# Patient Record
Sex: Female | Born: 1965 | Hispanic: Refuse to answer | Marital: Married
Health system: Southern US, Community
[De-identification: ages and names within clinical notes are randomized; demographics above are authoritative.]

## PROBLEM LIST (undated history)

## (undated) ENCOUNTER — Emergency Department (HOSPITAL_BASED_OUTPATIENT_CLINIC_OR_DEPARTMENT_OTHER): Admission: EM | Payer: PRIVATE HEALTH INSURANCE | Source: Home / Self Care

---

## 1999-03-11 ENCOUNTER — Ambulatory Visit: Admission: RE | Admit: 1999-03-11 | Discharge: 1999-03-11 | Payer: Self-pay | Admitting: Radiation Oncology

## 1999-04-29 ENCOUNTER — Other Ambulatory Visit: Admission: RE | Admit: 1999-04-29 | Discharge: 1999-04-29 | Payer: Self-pay | Admitting: Gynecology

## 1999-11-27 ENCOUNTER — Other Ambulatory Visit: Admission: RE | Admit: 1999-11-27 | Discharge: 1999-11-27 | Payer: Self-pay | Admitting: Gynecology

## 1999-12-17 ENCOUNTER — Ambulatory Visit (HOSPITAL_COMMUNITY): Admission: RE | Admit: 1999-12-17 | Discharge: 1999-12-17 | Payer: Self-pay | Admitting: Internal Medicine

## 2001-02-26 ENCOUNTER — Encounter: Admission: RE | Admit: 2001-02-26 | Discharge: 2001-02-26 | Payer: Self-pay | Admitting: Gynecology

## 2001-02-26 ENCOUNTER — Encounter: Payer: Self-pay | Admitting: Gynecology

## 2003-03-22 ENCOUNTER — Other Ambulatory Visit: Admission: RE | Admit: 2003-03-22 | Discharge: 2003-03-22 | Payer: Self-pay | Admitting: Internal Medicine

## 2003-03-29 ENCOUNTER — Encounter: Admission: RE | Admit: 2003-03-29 | Discharge: 2003-03-29 | Payer: Self-pay | Admitting: Internal Medicine

## 2003-04-25 ENCOUNTER — Encounter: Admission: RE | Admit: 2003-04-25 | Discharge: 2003-04-25 | Payer: Self-pay | Admitting: Internal Medicine

## 2005-03-18 ENCOUNTER — Encounter: Admission: RE | Admit: 2005-03-18 | Discharge: 2005-03-18 | Payer: Self-pay | Admitting: Internal Medicine

## 2005-04-03 ENCOUNTER — Encounter: Admission: RE | Admit: 2005-04-03 | Discharge: 2005-04-03 | Payer: Self-pay | Admitting: Internal Medicine

## 2005-05-20 ENCOUNTER — Other Ambulatory Visit: Admission: RE | Admit: 2005-05-20 | Discharge: 2005-05-20 | Payer: Self-pay | Admitting: Internal Medicine

## 2005-06-11 ENCOUNTER — Encounter: Admission: RE | Admit: 2005-06-11 | Discharge: 2005-06-11 | Payer: Self-pay | Admitting: Internal Medicine

## 2006-06-17 ENCOUNTER — Other Ambulatory Visit: Admission: RE | Admit: 2006-06-17 | Discharge: 2006-06-17 | Payer: Self-pay | Admitting: *Deleted

## 2008-07-22 ENCOUNTER — Ambulatory Visit: Payer: Self-pay | Admitting: Family Medicine

## 2008-07-22 DIAGNOSIS — L0291 Cutaneous abscess, unspecified: Secondary | ICD-10-CM | POA: Insufficient documentation

## 2008-07-22 DIAGNOSIS — L0390 Cellulitis, unspecified: Secondary | ICD-10-CM

## 2009-02-04 ENCOUNTER — Ambulatory Visit: Payer: Self-pay | Admitting: Family Medicine

## 2009-02-04 DIAGNOSIS — N3000 Acute cystitis without hematuria: Secondary | ICD-10-CM | POA: Insufficient documentation

## 2009-02-04 LAB — CONVERTED CEMR LAB
Bilirubin Urine: NEGATIVE
Glucose, Urine, Semiquant: NEGATIVE
Urobilinogen, UA: 0.2
pH: 5.5

## 2009-02-05 ENCOUNTER — Encounter: Payer: Self-pay | Admitting: Family Medicine

## 2009-05-21 ENCOUNTER — Ambulatory Visit: Payer: Self-pay | Admitting: Family Medicine

## 2009-05-21 LAB — CONVERTED CEMR LAB
Bilirubin Urine: NEGATIVE
Glucose, Urine, Semiquant: NEGATIVE
Protein, U semiquant: NEGATIVE
Specific Gravity, Urine: 1.015
pH: 6

## 2009-05-22 ENCOUNTER — Encounter: Payer: Self-pay | Admitting: Family Medicine

## 2009-09-29 ENCOUNTER — Ambulatory Visit: Payer: Self-pay | Admitting: Emergency Medicine

## 2009-09-29 DIAGNOSIS — J209 Acute bronchitis, unspecified: Secondary | ICD-10-CM | POA: Insufficient documentation

## 2010-03-17 ENCOUNTER — Encounter: Payer: Self-pay | Admitting: Internal Medicine

## 2010-03-26 NOTE — Assessment & Plan Note (Signed)
Summary: Cough-yellowish, SOB x 2 wks rm 1   Vital Signs:  Patient Profile:   45 Years Old Female CC:      Cold & URI symptoms Height:     64 inches Weight:      135 pounds O2 Sat:      97 % O2 treatment:    Room Air Temp:     98.6 degrees F oral Pulse rate:   79 / minute Pulse rhythm:   regular Resp:     16 per minute BP sitting:   113 / 80  (right arm) Cuff size:   regular  Vitals Entered By: Areta Haber CMA (September 29, 2009 12:19 PM)                  Current Allergies: ! MORPHINE ! COMPAZINE      History of Present Illness Chief Complaint: Cold & URI symptoms History of Present Illness: Cold & URI symptoms for 2 weeks.  Was getting better, but now feels like it is a tickle in her chest and she is coughing.  Also coughing up green phlegm.  She has had bronchitis in the past and this feels the same.  No F/C/N/V.  OTC cough & cold meds help a little bit.  She also has felt some minor wheezing as well and wants to know if she can have an inhaler.  Current Problems: BRONCHITIS, VIRAL (ICD-466.0) ACUTE CYSTITIS (ICD-595.0) ACUTE CYSTITIS (ICD-595.0) CELLULITIS (ICD-682.9)   Current Meds LEVOXYL 125 MCG TABS (LEVOTHYROXINE SODIUM) once a day AUGMENTIN 875-125 MG TABS (AMOXICILLIN-POT CLAVULANATE) 1 tab by mouth two times a day for 10 days VENTOLIN HFA 108 (90 BASE) MCG/ACT AERS (ALBUTEROL SULFATE) 1-2 puffs every 6-8 hours as needed wheeze/cough TESSALON 200 MG CAPS (BENZONATATE) 1 tab by mouth up to three times a day as needed for cough  REVIEW OF SYSTEMS Constitutional Symptoms      Denies fever, chills, night sweats, weight loss, weight gain, and fatigue.  Eyes       Denies change in vision, eye pain, eye discharge, glasses, contact lenses, and eye surgery. Ear/Nose/Throat/Mouth       Complains of frequent runny nose.      Denies hearing loss/aids, change in hearing, ear pain, ear discharge, dizziness, frequent nose bleeds, sinus problems, sore throat,  hoarseness, and tooth pain or bleeding.  Respiratory       Complains of productive cough, wheezing, and shortness of breath.      Denies dry cough, asthma, bronchitis, and emphysema/COPD.  Cardiovascular       Denies murmurs, chest pain, and tires easily with exhertion.    Gastrointestinal       Denies stomach pain, nausea/vomiting, diarrhea, constipation, blood in bowel movements, and indigestion. Genitourniary       Denies painful urination, kidney stones, and loss of urinary control. Neurological       Denies paralysis, seizures, and fainting/blackouts. Musculoskeletal       Denies muscle pain, joint pain, joint stiffness, decreased range of motion, redness, swelling, muscle weakness, and gout.  Skin       Denies bruising, unusual mles/lumps or sores, and hair/skin or nail changes.  Psych       Denies mood changes, temper/anger issues, anxiety/stress, speech problems, depression, and sleep problems. Other Comments: x 2 wks. Pt has not seen her PCP for this.   Past History:  Past Medical History: Last updated: 07/22/2008 hypothroidism history of erythema nedosium  Past Surgical History: Last updated:  07/22/2008 cervical laminectomy 1998 emergent hysterectomy after childbirth 1991  Family History: Last updated: 07/22/2008 mother - history of ovarian and breast cancer father, two brothers and one sister all alive and healthy  Social History: Last updated: 02/04/2009 denies recreational drug use drinks socially Current Smoker  Risk Factors: Smoking Status: current (02/04/2009) Physical Exam General appearance: well developed, well nourished, no acute distress Ears: normal, no lesions or deformities Nasal: mucosa pink, nonedematous, no septal deviation, turbinates normal Neck: neck supple,  trachea midline, no masses Chest/Lungs: no rales, wheezes, or rhonchi bilateral, breath sounds equal without effort Heart: regular rate and  rhythm, no murmur Skin: no obvious  rashes or lesions Assessment New Problems: BRONCHITIS, VIRAL (ICD-466.0)   Patient Education: Patient and/or caregiver instructed in the following: rest, fluids, Ibuprofen prn. Demonstrates willingness to comply.  Plan New Medications/Changes: TESSALON 200 MG CAPS (BENZONATATE) 1 tab by mouth up to three times a day as needed for cough  #21 x 0, 09/29/2009, Hoyt Koch MD VENTOLIN HFA 108 (90 BASE) MCG/ACT AERS (ALBUTEROL SULFATE) 1-2 puffs every 6-8 hours as needed wheeze/cough  #1 x 0, 09/29/2009, Hoyt Koch MD AUGMENTIN [phone] MG TABS (AMOXICILLIN-POT CLAVULANATE) 1 tab by mouth two times a day for 10 days  #20 x 0, 09/29/2009, Hoyt Koch MD  New Orders: Est. Patient Level II [phone] Planning Comments:   Take the meds as directed.  If worsening symptoms, fever, cough, Follow-up with your primary care physician   The patient and/or caregiver has been counseled thoroughly with regard to medications prescribed including dosage, schedule, interactions, rationale for use, and possible side effects and they verbalize understanding.  Diagnoses and expected course of recovery discussed and will return if not improved as expected or if the condition worsens. Patient and/or caregiver verbalized understanding.  Prescriptions: TESSALON 200 MG CAPS (BENZONATATE) 1 tab by mouth up to three times a day as needed for cough  #21 x 0   Entered and Authorized by:   Hoyt Koch MD   Signed by:   Hoyt Koch MD on 09/29/2009   Method used:   Printed then faxed to ...       [facility]  [address] [phone]* (retail)       [address] [place], [place]  [address]       Ph: [phone] or [phone]       Fax: [phone]   RxID:   [id number] VENTOLIN HFA 108 (90 BASE) MCG/ACT AERS (ALBUTEROL SULFATE) 1-2 puffs every 6-8 hours as needed wheeze/cough  #1 x 0   Entered and Authorized by:   Hoyt Koch MD   Signed by:   Hoyt Koch MD on 09/29/2009    Method used:   Printed then faxed to ...       [facility]  [address] [phone]* (retail)       [address] [place], [place]  [address]       Ph: [phone] or [phone]       Fax: [phone]   RxID:   [id number] AUGMENTIN 875-125 MG TABS (AMOXICILLIN-POT CLAVULANATE) 1 tab by mouth two times a day for 10 days  #20 x 0   Entered and Authorized by:   Hoyt Koch MD   Signed by:   Hoyt Koch MD on 09/29/2009   Method used:   Printed then faxed to ...       [facility]  [address] [phone]* (retail)  [address]       [place], [place]  [address]       Ph: [phone] or [phone]       Fax: [phone]   RxID:   [id number]   Orders Added: 1)  Est. Patient Level II [440[address]]

## 2010-03-26 NOTE — Assessment & Plan Note (Signed)
Summary: Frequent, painful urination x 4 dys rm 3   Vital Signs:  Patient Profile:   45 Years Old Female CC:      Frequent, painful urination x 4 dys Height:     64 inches Weight:      134 pounds O2 Sat:      100 % O2 treatment:    Room Air Temp:     98.1 degrees F oral Pulse rate:   72 / minute Pulse rhythm:   regular Resp:     16 per minute BP sitting:   116 / 80  (right arm) Cuff size:   regular  Vitals Entered By: Areta Haber CMA (May 21, 2009 6:29 PM)                  Current Allergies: ! MORPHINE ! COMPAZINE     History of Present Illness Chief Complaint: Frequent, painful urination x 4 dys History of Present Illness: Subjective:  Patient presents complaining of UTI symptoms for 4 days.  Complains of dysuria, frequency, and urgency.  No hematuria or nocturia.  No abnormal vaginal discharge.  No fever/chills/sweats.  No abdominal pain.  + mild right flank pain.  No nausea/vomiting.  Has had a hysterectomy. She notes that during her last UTI she became acutely sick while she was filling her antibiotic prescription, and was hospitalized for pyelonephritis. She reports that she is developing UTI's more frequently.  Current Problems: ACUTE CYSTITIS (ICD-595.0) ACUTE CYSTITIS (ICD-595.0) CELLULITIS (ICD-682.9)   Current Meds LEVOXYL 125 MCG TABS (LEVOTHYROXINE SODIUM) once a day CEPHALEXIN 500 MG CAPS (CEPHALEXIN) One by mouth two times a day  REVIEW OF SYSTEMS Constitutional Symptoms      Denies fever, chills, night sweats, weight loss, weight gain, and fatigue.  Eyes       Denies change in vision, eye pain, eye discharge, glasses, contact lenses, and eye surgery. Ear/Nose/Throat/Mouth       Denies hearing loss/aids, change in hearing, ear pain, ear discharge, dizziness, frequent runny nose, frequent nose bleeds, sinus problems, sore throat, hoarseness, and tooth pain or bleeding.  Respiratory       Denies dry cough, productive cough, wheezing,  shortness of breath, asthma, bronchitis, and emphysema/COPD.  Cardiovascular       Denies murmurs, chest pain, and tires easily with exhertion.    Gastrointestinal       Denies stomach pain, nausea/vomiting, diarrhea, constipation, blood in bowel movements, and indigestion. Genitourniary       Complains of painful urination.      Denies kidney stones and loss of urinary control.      Comments: Frequent x 4 dys Neurological       Denies paralysis, seizures, and fainting/blackouts. Musculoskeletal       Denies muscle pain, joint pain, joint stiffness, decreased range of motion, redness, swelling, muscle weakness, and gout.  Skin       Denies bruising, unusual mles/lumps or sores, and hair/skin or nail changes.  Psych       Denies mood changes, temper/anger issues, anxiety/stress, speech problems, depression, and sleep problems. Other Comments: Pt has not seen PCP for this. Pt states that she has been drinking cranberry juice.   Past History:  Past Medical History: Last updated: 07/22/2008 hypothroidism history of erythema nedosium  Past Surgical History: Last updated: 07/22/2008 cervical laminectomy 1998 emergent hysterectomy after childbirth 1991  Family History: Last updated: 07/22/2008 mother - history of ovarian and breast cancer father, two brothers and one sister all alive  and healthy  Social History: Last updated: 02/04/2009 denies recreational drug use drinks socially Current Smoker  Risk Factors: Smoking Status: current (02/04/2009)   Objective:  Appearance:  Patient appears healthy, stated age, and in no acute distress  Mouth:  moist mucous membranes  Neck:  Supple.  No adenopathy is present.  Lungs:  Clear to auscultation.  Breath sounds are equal.  Heart:  Regular rate and rhythm without murmurs, rubs, or gallops.  Abdomen:  Nontender without masses or hepatosplenomegaly.  Bowel sounds are present.  Mild right flank tenderness.  urinalysis (dipstick):  Mod blood, small leuks Assessment New Problems: ACUTE CYSTITIS (ICD-595.0)  RECURRENT UTI  Plan New Medications/Changes: CEPHALEXIN 500 MG CAPS (CEPHALEXIN) One by mouth two times a day  #14 x 0, 05/21/2009, Donna Christen MD  New Orders: Urinalysis [81003-65000] Est. Patient Level III [99213] T-Culture, Urine [[id number]] Rocephin  250mg  [J0696] Admin of Therapeutic Inj  intramuscular or subcutaneous [96372] Planning Comments:   Will give Rocephin 500mg  IM to start, then Keflex 500mg  two times a day for one week. Culture pending.  Increase fluid intake. Follow-up with PCP if not improving one week.  Return for worsening symptoms When well, recommed follow-up with urologist for recurring UTI's    Diagnoses and expected course of recovery discussed and will return if not improved as expected or if the condition worsens. Patient and/or caregiver verbalized understanding.  Prescriptions: CEPHALEXIN 500 MG CAPS (CEPHALEXIN) One by mouth two times a day  #14 x 0   Entered and Authorized by:   Donna Christen MD   Signed by:   Donna Christen MD on 05/21/2009   Method used:   Print then Give to Patient   RxID:   [id number]   Laboratory Results   Urine Tests  Date/Time Received: May 21, 2009 6:45 PM  Date/Time Reported: May 21, 2009 6:45 PM   Routine Urinalysis   Color: lt. yellow Appearance: Cloudy Glucose: negative   (Normal Range: Negative) Bilirubin: negative   (Normal Range: Negative) Ketone: negative   (Normal Range: Negative) Spec. Gravity: 1.015   (Normal Range: 1.003-1.035) Blood: moderate   (Normal Range: Negative) pH: 6.0   (Normal Range: 5.0-8.0) Protein: negative   (Normal Range: Negative) Urobilinogen: 0.2   (Normal Range: 0-1) Nitrite: negative   (Normal Range: Negative) Leukocyte Esterace: small   (Normal Range: Negative)         Medication Administration  Injection # 1:    Medication: Rocephin  250mg     Diagnosis: ACUTE CYSTITIS  (ICD-595.0)    Route: IM    Site: RUOQ gluteus    Exp Date: 04/24/2011    Lot #: [id number]    Mfr: Sandox    Comments: Administered 500 mg    Patient tolerated injection without complications    Given by: Areta Haber CMA (May 21, 2009 7:11 PM)  Orders Added: 1)  Urinalysis [81003-65000] 2)  Est. Patient Level III [84696] 3)  T-Culture, Urine [[id number]] 4)  Rocephin  250mg  [J0696] 5)  Admin of Therapeutic Inj  intramuscular or subcutaneous [40102]

## 2011-07-14 ENCOUNTER — Other Ambulatory Visit: Payer: Self-pay | Admitting: *Deleted

## 2011-07-14 DIAGNOSIS — Z1231 Encounter for screening mammogram for malignant neoplasm of breast: Secondary | ICD-10-CM

## 2011-08-12 ENCOUNTER — Ambulatory Visit
Admission: RE | Admit: 2011-08-12 | Discharge: 2011-08-12 | Disposition: A | Discharge status: Home / Self Care | Payer: No Typology Code available for payment source | Source: Ambulatory Visit | Attending: *Deleted | Admitting: *Deleted

## 2011-08-12 DIAGNOSIS — Z1231 Encounter for screening mammogram for malignant neoplasm of breast: Secondary | ICD-10-CM

## 2011-08-18 ENCOUNTER — Other Ambulatory Visit: Payer: Self-pay | Admitting: *Deleted

## 2011-08-18 DIAGNOSIS — R928 Other abnormal and inconclusive findings on diagnostic imaging of breast: Secondary | ICD-10-CM

## 2011-08-25 ENCOUNTER — Ambulatory Visit
Admission: RE | Admit: 2011-08-25 | Discharge: 2011-08-25 | Disposition: A | Discharge status: Home / Self Care | Payer: No Typology Code available for payment source | Source: Ambulatory Visit | Attending: *Deleted | Admitting: *Deleted

## 2011-08-25 ENCOUNTER — Other Ambulatory Visit: Payer: Self-pay | Admitting: *Deleted

## 2011-08-25 DIAGNOSIS — R928 Other abnormal and inconclusive findings on diagnostic imaging of breast: Secondary | ICD-10-CM

## 2011-09-04 ENCOUNTER — Ambulatory Visit
Admission: RE | Admit: 2011-09-04 | Discharge: 2011-09-04 | Disposition: A | Discharge status: Home / Self Care | Payer: No Typology Code available for payment source | Source: Ambulatory Visit | Attending: *Deleted | Admitting: *Deleted

## 2011-09-04 ENCOUNTER — Other Ambulatory Visit: Payer: Self-pay | Admitting: *Deleted

## 2011-09-04 DIAGNOSIS — R928 Other abnormal and inconclusive findings on diagnostic imaging of breast: Secondary | ICD-10-CM

## 2011-09-04 HISTORY — PX: BREAST BIOPSY: SHX20

## 2012-09-10 ENCOUNTER — Other Ambulatory Visit: Payer: Self-pay | Admitting: Physician Assistant

## 2012-09-10 DIAGNOSIS — N6320 Unspecified lump in the left breast, unspecified quadrant: Secondary | ICD-10-CM

## 2012-09-24 ENCOUNTER — Ambulatory Visit
Admission: RE | Admit: 2012-09-24 | Discharge: 2012-09-24 | Disposition: A | Discharge status: Home / Self Care | Payer: No Typology Code available for payment source | Source: Ambulatory Visit

## 2012-09-24 ENCOUNTER — Other Ambulatory Visit: Payer: No Typology Code available for payment source

## 2012-09-24 ENCOUNTER — Ambulatory Visit
Admission: RE | Admit: 2012-09-24 | Discharge: 2012-09-24 | Disposition: A | Discharge status: Home / Self Care | Payer: No Typology Code available for payment source | Source: Ambulatory Visit | Attending: Physician Assistant | Admitting: Physician Assistant

## 2012-09-24 DIAGNOSIS — N6320 Unspecified lump in the left breast, unspecified quadrant: Secondary | ICD-10-CM

## 2013-12-09 ENCOUNTER — Other Ambulatory Visit: Payer: Self-pay | Admitting: Physician Assistant

## 2013-12-09 DIAGNOSIS — N6320 Unspecified lump in the left breast, unspecified quadrant: Secondary | ICD-10-CM

## 2013-12-23 ENCOUNTER — Ambulatory Visit
Admission: RE | Admit: 2013-12-23 | Discharge: 2013-12-23 | Disposition: A | Discharge status: Home / Self Care | Payer: PRIVATE HEALTH INSURANCE | Source: Ambulatory Visit | Attending: Physician Assistant | Admitting: Physician Assistant

## 2013-12-23 DIAGNOSIS — N6320 Unspecified lump in the left breast, unspecified quadrant: Secondary | ICD-10-CM

## 2015-11-15 ENCOUNTER — Other Ambulatory Visit: Payer: Self-pay | Admitting: *Deleted

## 2015-11-15 DIAGNOSIS — Z1231 Encounter for screening mammogram for malignant neoplasm of breast: Secondary | ICD-10-CM

## 2015-11-22 ENCOUNTER — Ambulatory Visit
Admission: RE | Admit: 2015-11-22 | Discharge: 2015-11-22 | Disposition: A | Discharge status: Home / Self Care | Payer: PRIVATE HEALTH INSURANCE | Source: Ambulatory Visit | Attending: *Deleted | Admitting: *Deleted

## 2015-11-22 DIAGNOSIS — Z1231 Encounter for screening mammogram for malignant neoplasm of breast: Secondary | ICD-10-CM

## 2016-02-22 ENCOUNTER — Other Ambulatory Visit: Payer: Self-pay | Admitting: Nurse Practitioner

## 2016-02-22 DIAGNOSIS — R5381 Other malaise: Secondary | ICD-10-CM

## 2017-04-16 ENCOUNTER — Other Ambulatory Visit: Payer: Self-pay | Admitting: Nurse Practitioner

## 2017-04-16 DIAGNOSIS — Z1231 Encounter for screening mammogram for malignant neoplasm of breast: Secondary | ICD-10-CM

## 2019-08-08 ENCOUNTER — Other Ambulatory Visit: Payer: Self-pay | Admitting: *Deleted

## 2019-08-08 DIAGNOSIS — Z1231 Encounter for screening mammogram for malignant neoplasm of breast: Secondary | ICD-10-CM

## 2019-08-24 ENCOUNTER — Ambulatory Visit
Admission: RE | Admit: 2019-08-24 | Discharge: 2019-08-24 | Disposition: A | Discharge status: Home / Self Care | Payer: PRIVATE HEALTH INSURANCE | Source: Ambulatory Visit | Attending: *Deleted | Admitting: *Deleted

## 2019-08-24 ENCOUNTER — Other Ambulatory Visit: Payer: Self-pay

## 2019-08-24 DIAGNOSIS — Z1231 Encounter for screening mammogram for malignant neoplasm of breast: Secondary | ICD-10-CM

## 2020-10-09 ENCOUNTER — Other Ambulatory Visit: Payer: Self-pay | Admitting: *Deleted

## 2020-10-09 DIAGNOSIS — Z1231 Encounter for screening mammogram for malignant neoplasm of breast: Secondary | ICD-10-CM

## 2020-10-11 ENCOUNTER — Ambulatory Visit
Admission: RE | Admit: 2020-10-11 | Discharge: 2020-10-11 | Disposition: A | Discharge status: Home / Self Care | Payer: No Typology Code available for payment source | Source: Ambulatory Visit | Attending: *Deleted | Admitting: *Deleted

## 2020-10-11 ENCOUNTER — Other Ambulatory Visit: Payer: Self-pay

## 2020-10-11 DIAGNOSIS — Z1231 Encounter for screening mammogram for malignant neoplasm of breast: Secondary | ICD-10-CM

## 2022-02-19 ENCOUNTER — Other Ambulatory Visit: Payer: Self-pay

## 2023-06-06 IMAGING — MG MM DIGITAL SCREENING BILAT W/ TOMO AND CAD
8 series · 9 of 24 positions shown · non-contrast
Comparison: Previous exam(s).

CLINICAL DATA: Screening.

EXAM:
DIGITAL SCREENING BILATERAL MAMMOGRAM WITH TOMOSYNTHESIS AND CAD
TECHNIQUE: Bilateral screening digital craniocaudal and mediolateral oblique
mammograms were obtained. Bilateral screening digital breast
tomosynthesis was performed. The images were evaluated with
computer-aided detection.

[L MLO synth-2D]
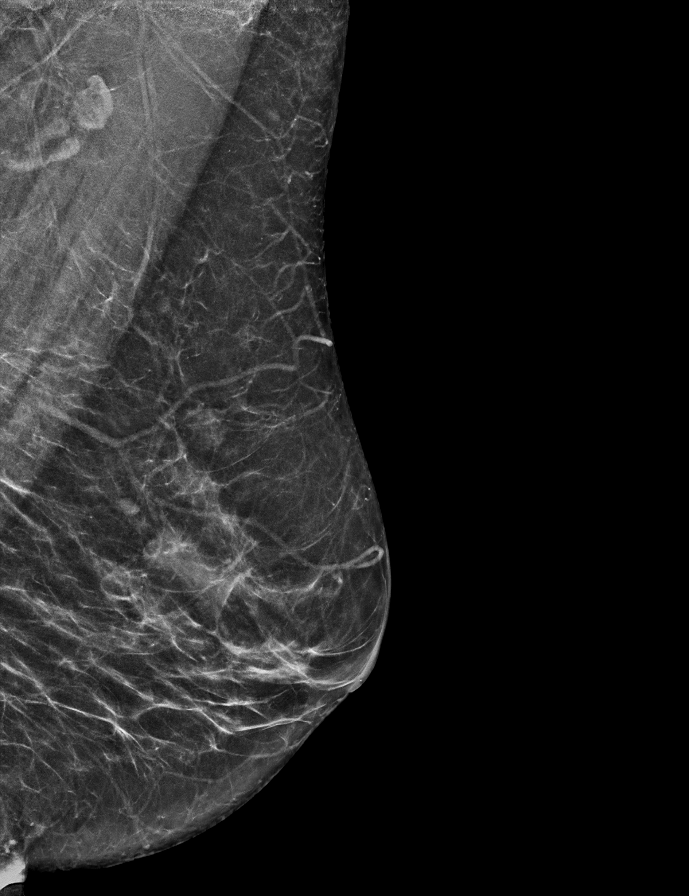

[R CC synth-2D]
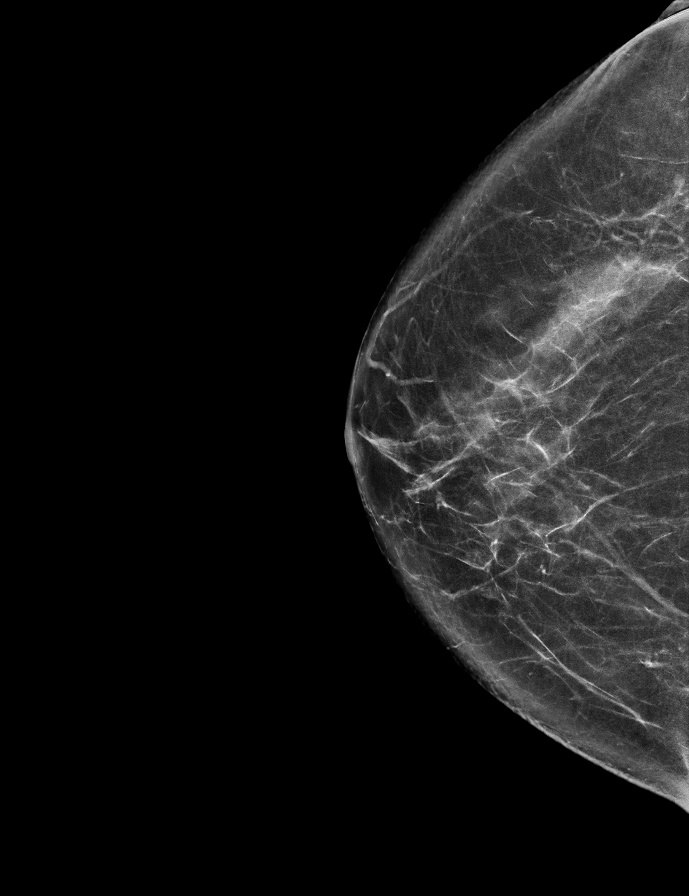

[R MLO synth-2D]
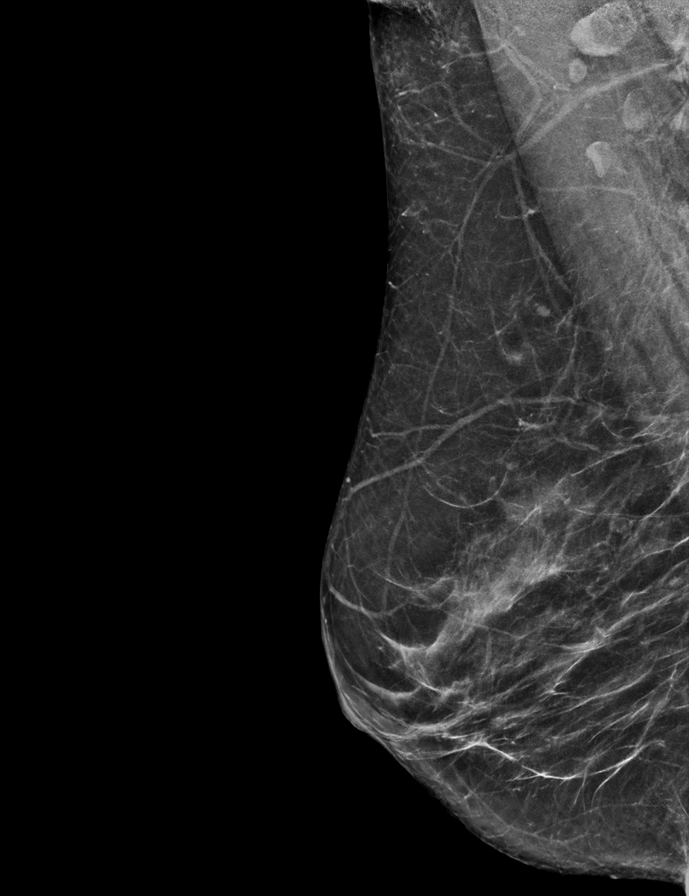

[L CC synth-2D]
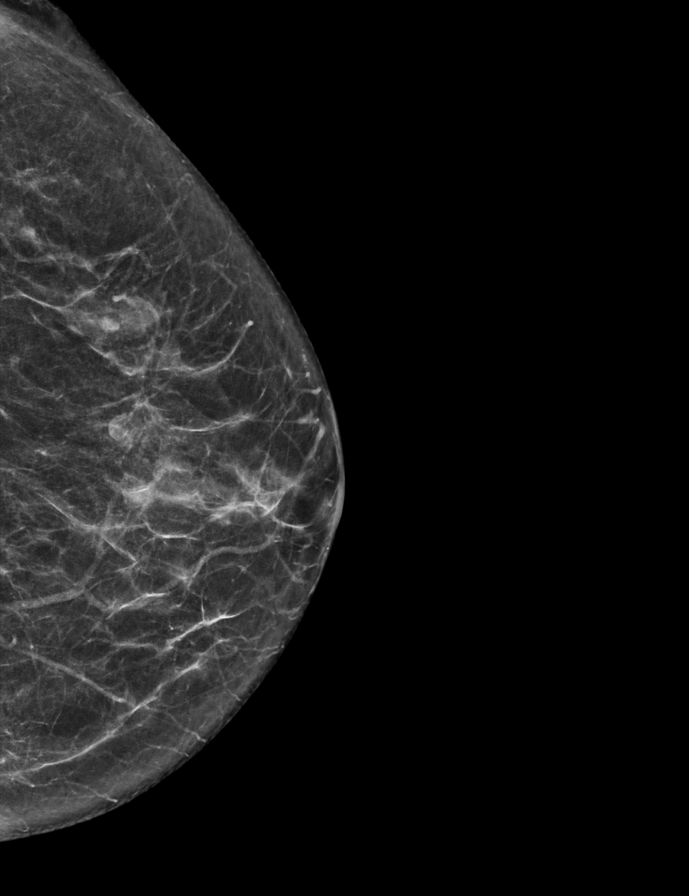

[R CC tomo · 2 of 67 frames shown]
[frame 22/67]
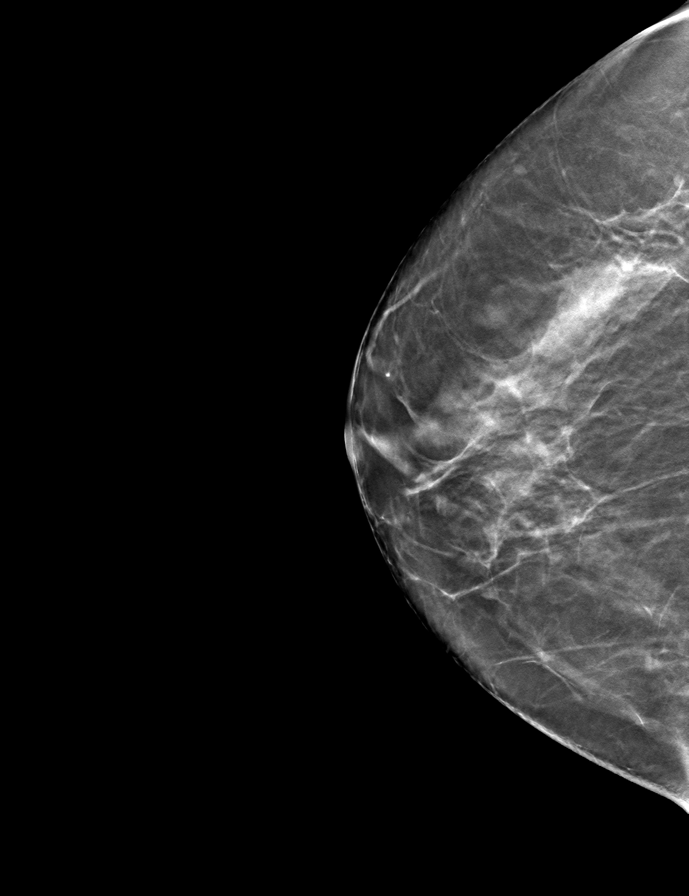
[frame 34/67]
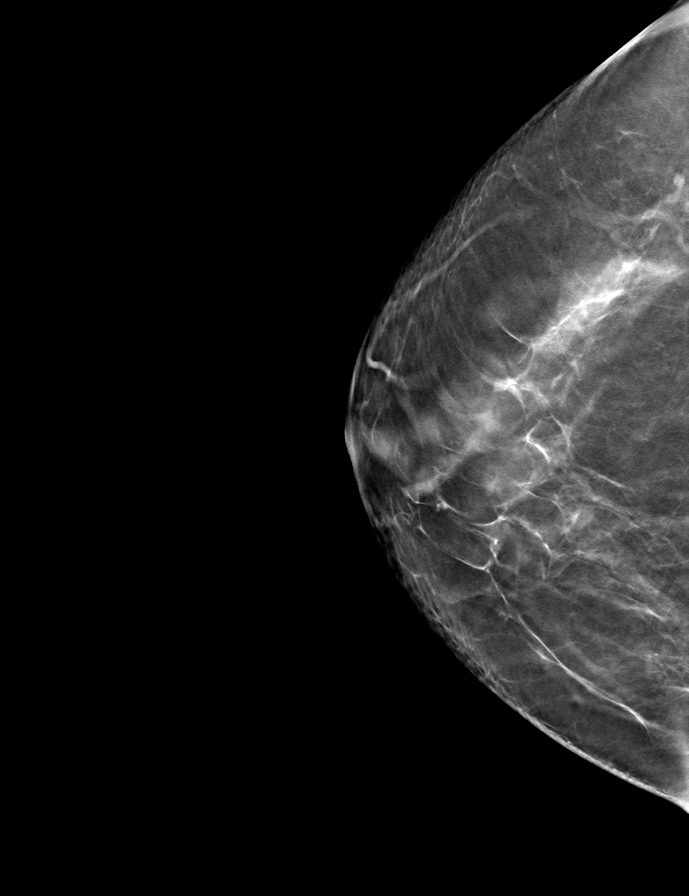

[L MLO tomo · tomo slice 30/59.0]
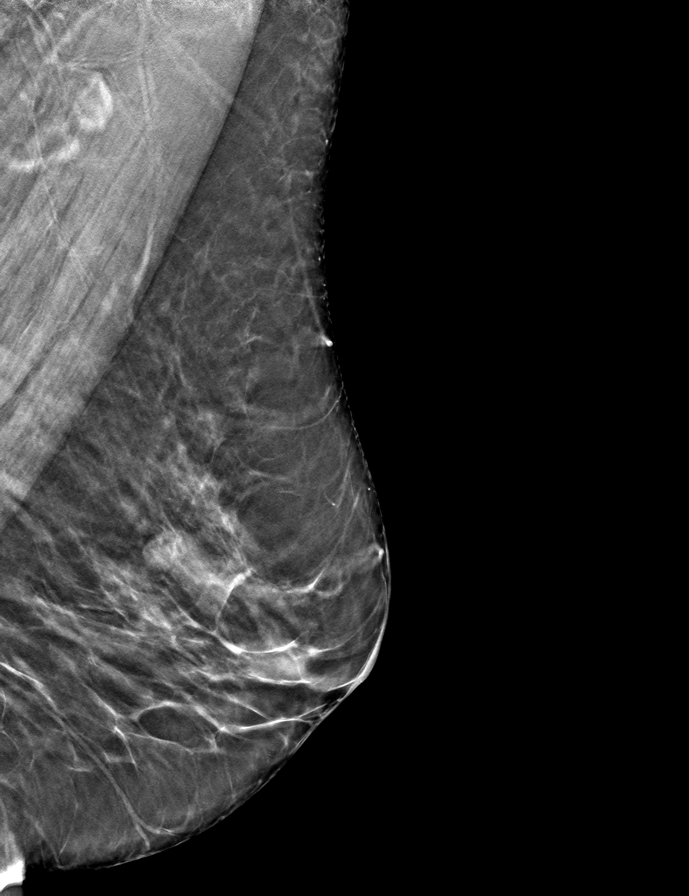

[L CC tomo · tomo slice 29/57.0]
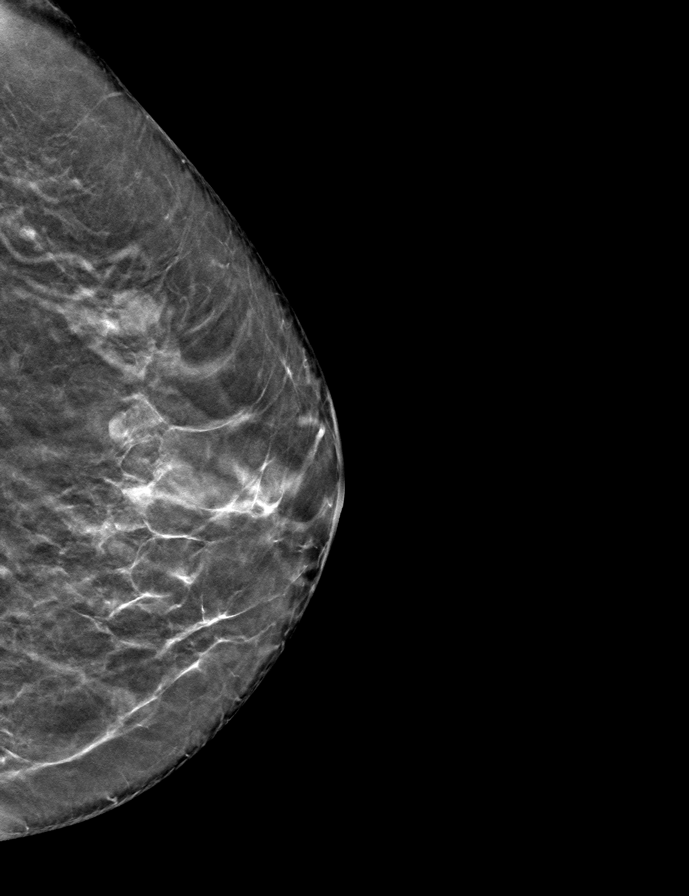

[R MLO tomo · tomo slice 32/63.0]
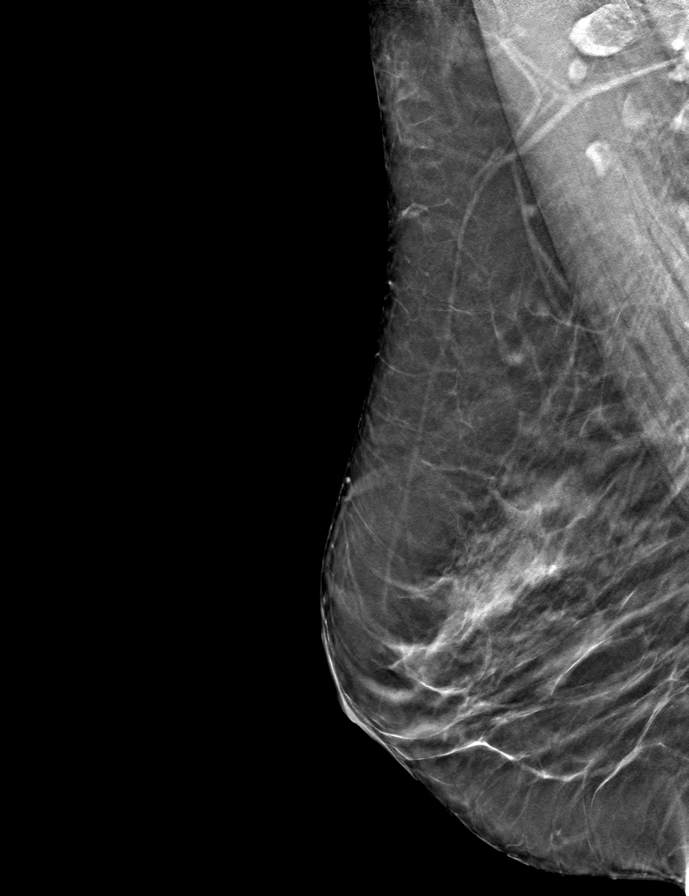

[9 of 24 positions shown; findings below may reference images not displayed]

ACR Breast Density Category b: There are scattered areas of
fibroglandular density.
FINDINGS: There are no findings suspicious for malignancy.
IMPRESSION: No mammographic evidence of malignancy. A result letter of this
screening mammogram will be mailed directly to the patient.

RECOMMENDATION:
Screening mammogram in one year. (Code:51-O-LD2)

BI-RADS CATEGORY  1: Negative.
